# Patient Record
Sex: Female | Born: 1980 | Race: White | Hispanic: No | Marital: Married | Smoking: Never smoker
Health system: Southern US, Community
[De-identification: ages and names within clinical notes are randomized; demographics above are authoritative.]

## PROBLEM LIST (undated history)

## (undated) DIAGNOSIS — Z789 Other specified health status: Secondary | ICD-10-CM

## (undated) DIAGNOSIS — IMO0001 Reserved for inherently not codable concepts without codable children: Secondary | ICD-10-CM

## (undated) HISTORY — PX: EYE SURGERY: SHX253

---

## 2003-04-05 ENCOUNTER — Encounter: Payer: Self-pay | Admitting: Family Medicine

## 2003-04-05 ENCOUNTER — Encounter: Admission: RE | Admit: 2003-04-05 | Discharge: 2003-04-05 | Payer: Self-pay | Admitting: Family Medicine

## 2004-03-20 ENCOUNTER — Emergency Department (HOSPITAL_COMMUNITY): Admission: EM | Admit: 2004-03-20 | Discharge: 2004-03-20 | Payer: Self-pay | Admitting: Emergency Medicine
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
## 2007-02-15 ENCOUNTER — Other Ambulatory Visit: Admission: RE | Admit: 2007-02-15 | Discharge: 2007-02-15 | Payer: Self-pay | Admitting: Family Medicine

## 2008-03-12 ENCOUNTER — Other Ambulatory Visit: Admission: RE | Admit: 2008-03-12 | Discharge: 2008-03-12 | Payer: Self-pay | Admitting: Family Medicine

## 2009-03-18 ENCOUNTER — Other Ambulatory Visit: Admission: RE | Admit: 2009-03-18 | Discharge: 2009-03-18 | Payer: Self-pay | Admitting: Family Medicine

## 2010-10-28 ENCOUNTER — Inpatient Hospital Stay (HOSPITAL_COMMUNITY): Admission: AD | Admit: 2010-10-28 | Discharge: 2010-10-30 | Payer: Self-pay | Admitting: Obstetrics and Gynecology

## 2011-02-10 LAB — CBC
HCT: 38.4 % (ref 36.0–46.0)
Hemoglobin: 10.1 g/dL — ABNORMAL LOW (ref 12.0–15.0)
MCH: 32.7 pg (ref 26.0–34.0)
MCH: 33.4 pg (ref 26.0–34.0)
MCHC: 34.5 g/dL (ref 30.0–36.0)
MCV: 95.3 fL (ref 78.0–100.0)
MCV: 96.9 fL (ref 78.0–100.0)
Platelets: 277 10*3/uL (ref 150–400)
RBC: 3.02 MIL/uL — ABNORMAL LOW (ref 3.87–5.11)
RBC: 4.03 MIL/uL (ref 3.87–5.11)

## 2011-12-01 NOTE — L&D Delivery Note (Signed)
Delivery Note  Labored in bath tub mostly. OO tub to bed at 0745, + urge to bear down  AROM at 0756, light mec fluid C/C/+2 0758 FHR category 1 in 2nd stage  At 8:11 AM a viable female was delivered via Vaginal, Spontaneous Delivery (Presentation: Right Occiput Anterior).  APGAR: 8, 9; weight pending.   Placenta status: Intact, Spontaneous.  Cord: 3 vessels with the following complications: loose CAN x1, reduced.  Cord pH: not done. Cord clamped after pulsation stopped, cut by FOB.   Anesthesia: None  Episiotomy: None Lacerations: 2nd degree Suture Repair: 3.0 vicryl rapide Est. Blood Loss (mL): 350  Mom to postpartum.  Baby to mother for bonding.Marland Kitchen  Sheleen Conchas 08/06/2012, 8:44 AM

## 2011-12-17 LAB — OB RESULTS CONSOLE HIV ANTIBODY (ROUTINE TESTING): HIV: NONREACTIVE

## 2011-12-17 LAB — OB RESULTS CONSOLE HEPATITIS B SURFACE ANTIGEN: Hepatitis B Surface Ag: NEGATIVE

## 2011-12-17 LAB — OB RESULTS CONSOLE RPR: RPR: NONREACTIVE

## 2011-12-17 LAB — OB RESULTS CONSOLE ABO/RH: RH Type: POSITIVE

## 2012-08-06 ENCOUNTER — Inpatient Hospital Stay (HOSPITAL_COMMUNITY)
Admission: AD | Admit: 2012-08-06 | Discharge: 2012-08-08 | DRG: 775 | Disposition: A | Discharge status: Home / Self Care | Payer: 59 | Source: Ambulatory Visit | Attending: Obstetrics & Gynecology | Admitting: Obstetrics & Gynecology

## 2012-08-06 ENCOUNTER — Encounter (HOSPITAL_COMMUNITY): Payer: Self-pay | Admitting: *Deleted

## 2012-08-06 DIAGNOSIS — IMO0001 Reserved for inherently not codable concepts without codable children: Secondary | ICD-10-CM

## 2012-08-06 HISTORY — DX: Reserved for inherently not codable concepts without codable children: IMO0001

## 2012-08-06 HISTORY — DX: Other specified health status: Z78.9

## 2012-08-06 LAB — CBC
HCT: 36.8 % (ref 36.0–46.0)
MCHC: 33.7 g/dL (ref 30.0–36.0)
Platelets: 223 10*3/uL (ref 150–400)
RDW: 13.5 % (ref 11.5–15.5)
WBC: 15.7 10*3/uL — ABNORMAL HIGH (ref 4.0–10.5)

## 2012-08-06 MED ORDER — IBUPROFEN 600 MG PO TABS
600.0000 mg | ORAL_TABLET | Freq: Four times a day (QID) | ORAL | Status: DC | PRN
  Administered 2012-08-06: 600 mg via ORAL
  Filled 2012-08-06: qty 1

## 2012-08-06 MED ORDER — ONDANSETRON HCL 4 MG/2ML IJ SOLN: 4.0000 mg | Freq: Four times a day (QID) | INTRAMUSCULAR | Status: DC | PRN

## 2012-08-06 MED ORDER — FLEET ENEMA 7-19 GM/118ML RE ENEM: 1.0000 | ENEMA | Freq: Every day | RECTAL | Status: DC | PRN

## 2012-08-06 MED ORDER — ONDANSETRON HCL 4 MG/2ML IJ SOLN: 4.0000 mg | INTRAMUSCULAR | Status: DC | PRN

## 2012-08-06 MED ORDER — IBUPROFEN 600 MG PO TABS
600.0000 mg | ORAL_TABLET | Freq: Four times a day (QID) | ORAL | Status: DC
  Administered 2012-08-06 – 2012-08-08 (×8): 600 mg via ORAL
  Filled 2012-08-06 (×8): qty 1

## 2012-08-06 MED ORDER — BISACODYL 10 MG RE SUPP: 10.0000 mg | Freq: Every day | RECTAL | Status: DC | PRN

## 2012-08-06 MED ORDER — DIBUCAINE 1 % RE OINT: 1.0000 "application " | TOPICAL_OINTMENT | RECTAL | Status: DC | PRN

## 2012-08-06 MED ORDER — TETANUS-DIPHTH-ACELL PERTUSSIS 5-2.5-18.5 LF-MCG/0.5 IM SUSP
0.5000 mL | Freq: Once | INTRAMUSCULAR | Status: AC
  Administered 2012-08-07: 0.5 mL via INTRAMUSCULAR
  Filled 2012-08-06: qty 0.5

## 2012-08-06 MED ORDER — PRENATAL MULTIVITAMIN CH
1.0000 | ORAL_TABLET | Freq: Every day | ORAL | Status: DC
  Administered 2012-08-06 – 2012-08-08 (×3): 1 via ORAL
  Filled 2012-08-06 (×3): qty 1

## 2012-08-06 MED ORDER — OXYTOCIN 40 UNITS IN LACTATED RINGERS INFUSION - SIMPLE MED: 62.5000 mL/h | Freq: Once | INTRAVENOUS | Status: DC

## 2012-08-06 MED ORDER — WITCH HAZEL-GLYCERIN EX PADS: 1.0000 "application " | MEDICATED_PAD | CUTANEOUS | Status: DC | PRN

## 2012-08-06 MED ORDER — DIPHENHYDRAMINE HCL 25 MG PO CAPS: 25.0000 mg | ORAL_CAPSULE | Freq: Four times a day (QID) | ORAL | Status: DC | PRN

## 2012-08-06 MED ORDER — LANOLIN HYDROUS EX OINT: TOPICAL_OINTMENT | CUTANEOUS | Status: DC | PRN

## 2012-08-06 MED ORDER — ZOLPIDEM TARTRATE 5 MG PO TABS: 5.0000 mg | ORAL_TABLET | Freq: Every evening | ORAL | Status: DC | PRN

## 2012-08-06 MED ORDER — OXYCODONE-ACETAMINOPHEN 5-325 MG PO TABS
1.0000 | ORAL_TABLET | ORAL | Status: DC | PRN
  Administered 2012-08-06: 1 via ORAL
  Administered 2012-08-07: 2 via ORAL
  Filled 2012-08-06 (×2): qty 2

## 2012-08-06 MED ORDER — OXYTOCIN 10 UNIT/ML IJ SOLN
10.0000 [IU] | Freq: Once | INTRAMUSCULAR | Status: DC
  Administered 2012-08-06: 10 [IU] via INTRAMUSCULAR
  Filled 2012-08-06: qty 1

## 2012-08-06 MED ORDER — ACETAMINOPHEN 325 MG PO TABS: 650.0000 mg | ORAL_TABLET | ORAL | Status: DC | PRN

## 2012-08-06 MED ORDER — FLEET ENEMA 7-19 GM/118ML RE ENEM: 1.0000 | ENEMA | RECTAL | Status: DC | PRN

## 2012-08-06 MED ORDER — BENZOCAINE-MENTHOL 20-0.5 % EX AERO
1.0000 "application " | INHALATION_SPRAY | CUTANEOUS | Status: DC | PRN
  Administered 2012-08-06: 1 via TOPICAL
  Filled 2012-08-06: qty 56

## 2012-08-06 MED ORDER — ONDANSETRON HCL 4 MG PO TABS: 4.0000 mg | ORAL_TABLET | ORAL | Status: DC | PRN

## 2012-08-06 MED ORDER — OXYCODONE-ACETAMINOPHEN 5-325 MG PO TABS
1.0000 | ORAL_TABLET | ORAL | Status: DC | PRN
  Administered 2012-08-06: 2 via ORAL
  Filled 2012-08-06: qty 2

## 2012-08-06 MED ORDER — LIDOCAINE HCL (PF) 1 % IJ SOLN
30.0000 mL | INTRAMUSCULAR | Status: DC | PRN
  Administered 2012-08-06: 30 mL via SUBCUTANEOUS
  Filled 2012-08-06: qty 30

## 2012-08-06 MED ORDER — SIMETHICONE 80 MG PO CHEW: 80.0000 mg | CHEWABLE_TABLET | ORAL | Status: DC | PRN

## 2012-08-06 MED ORDER — CITRIC ACID-SODIUM CITRATE 334-500 MG/5ML PO SOLN: 30.0000 mL | ORAL | Status: DC | PRN

## 2012-08-06 MED ORDER — LACTATED RINGERS IV SOLN: 500.0000 mL | INTRAVENOUS | Status: DC | PRN

## 2012-08-06 MED ORDER — OXYTOCIN BOLUS FROM INFUSION
500.0000 mL | Freq: Once | INTRAVENOUS | Status: DC
  Filled 2012-08-06: qty 500

## 2012-08-06 MED ORDER — SENNOSIDES-DOCUSATE SODIUM 8.6-50 MG PO TABS
2.0000 | ORAL_TABLET | Freq: Every day | ORAL | Status: DC
  Administered 2012-08-06 – 2012-08-07 (×2): 2 via ORAL

## 2012-08-06 NOTE — H&P (Signed)
Sherri Hodge is a 31 y.o. female presenting for labor. Desires unmedicated labor.  Maternal Medical History:  Reason for admission: Reason for admission: contractions and nausea (mild).  Started last night, more intense for past 3 hours.  Fetal activity: Perceived fetal activity is normal.   Last perceived fetal movement was within the past hour.    Prenatal complications: no prenatal complications   OB History    Grav Para Term Preterm Abortions TAB SAB Ect Mult Living   2 1 1       1      Past Medical History  Diagnosis Date  . Active labor 08/06/2012   Past Surgical History  Procedure Date  . Eye surgery     cyst removed as a child   Meds: PNV, blue cohosh, evening primrose oil  Family History: family history is not on file. Social History:  reports that she has never smoked. She does not have any smokeless tobacco history on file. She reports that she does not drink alcohol or use illicit drugs.   Prenatal Transfer Tool  Maternal Diabetes: No Genetic Screening: Declined Maternal Ultrasounds/Referrals: Normal Fetal Ultrasounds or other Referrals:  None Maternal Substance Abuse:  No Significant Maternal Medications:  None Significant Maternal Lab Results:  Lab values include: Group B Strep negative Other Comments:  None  Review of Systems  Eyes: Negative for blurred vision.  Gastrointestinal: Positive for nausea (mild).  Neurological: Negative for headaches.  All other systems reviewed and are negative.    Dilation: 6 Effacement (%): 100 Station: 0 Exam by:: Colon Flattery, CNM Last menstrual period 10/25/2011. Maternal Exam:  Uterine Assessment: Contraction strength is moderate.  Contraction duration is 60 seconds. Contraction frequency is regular.  Q 2-3 min   Abdomen: Patient reports no abdominal tenderness. Fundal height is S=D.   Estimated fetal weight is 7.5-8.   Fetal presentation: vertex  Introitus: Normal vulva. Normal vagina.  Pelvis: adequate for  delivery.   Proven to 7'13 Cervix: 6/100/0 BBOW    Fetal Exam Fetal Monitor Review: Mode: ultrasound.   Baseline rate: 150.  Variability: minimal (<5 bpm).   Pattern: no accelerations and no decelerations.    Fetal State Assessment: Category I - tracings are normal.     Filed Vitals:   08/06/12 0527  BP: 107/59  Pulse: 103  Temp: 98.4 F (36.9 C)  Resp: 18    Physical Exam  Constitutional: She is oriented to person, place, and time. She appears well-developed and well-nourished.  HENT:  Head: Normocephalic.  Eyes: Pupils are equal, round, and reactive to light.  Neck: Normal range of motion.  Cardiovascular: Normal rate.   Respiratory: Effort normal and breath sounds normal.  GI: Soft. She exhibits no distension.  Genitourinary: Vagina normal.  Musculoskeletal: Normal range of motion. She exhibits no edema.  Neurological: She is alert and oriented to person, place, and time.  Skin: Skin is dry.  Psychiatric: She has a normal mood and affect.    Prenatal labs: ABO, Rh: A/Positive/-- (01/17 0000) Antibody: Negative (01/17 0000) Rubella: Immune (01/17 0000) RPR: Nonreactive (01/17 0000)  HBsAg: Negative (01/17 0000)  HIV: Non-reactive (01/17 0000)  GBS: Negative (07/29 0000)  [place]: nl female anatomy Genetic screens declined 1GTT wnl  Assessment/Plan: IUP at term, spont labor Reassuring maternal / fetal status Desires natural labor GBS neg  Admit to BS Routine orders Anticipate NSVD   Consult Dr. Newman Pies 08/06/2012, 5:32 AM

## 2012-08-06 NOTE — Progress Notes (Signed)
Sherri Hodge is a 31 y.o. G2P1001 at [redacted]w[redacted]d by LMP admitted for active labor  Subjective: Breathing w/ ctx, coping well. OOB to ambulate after initial EFM strip. Good FM.  Objective: Filed Vitals:   08/06/12 0527 08/06/12 0540  BP: 107/59   Pulse: 103   Temp: 98.4 F (36.9 C)   TempSrc: Oral   Resp: 18   Height:  5' (1.524 m)  Weight:  70.761 kg (156 lb)      FHT:  FHR: 150 bpm, variability: moderate,  accelerations:  Present,  decelerations:  Present occasional early decel's UC:   irregular, every 2-4 minutes SVE:   Dilation: 6 Effacement (%): 100 Station: 0 Exam by:: Sherri Hodge, CNM  Labs:  No results found for this basename: WBC:2,HGB:2,HCT:2,PLT:2 in the last 72 hours  Assessment / Plan: IUP at 40.6 wks, spont labor  Labor: Progressing normally Preeclampsia:  n/a Fetal Wellbeing:  Category I Pain Control:  Labor support without medications I/D:  GBS neg Anticipated MOD:  NSVD  Sherri Hodge,Sherri Hodge 08/06/2012, 5:53 AM

## 2012-08-06 NOTE — Plan of Care (Signed)
Problem: Consults Goal: Birthing Suites Patient Information Press F2 to bring up selections list Outcome: Completed/Met Date Met:  08/06/12  Pt > [redacted] weeks EGA  Comments:  40.6 wks

## 2012-08-07 LAB — CBC
HCT: 30 % — ABNORMAL LOW (ref 36.0–46.0)
MCH: 31.4 pg (ref 26.0–34.0)
MCHC: 33.7 g/dL (ref 30.0–36.0)
RDW: 13.6 % (ref 11.5–15.5)

## 2012-08-07 NOTE — Progress Notes (Signed)
PPD 1 SVD  S:  Reports feeling well.             Tolerating po/ No nausea or vomiting             Bleeding is moderate, small clot this am.             Pain controlled with Motrin             Up ad lib / ambulatory / voiding well   Newborn  Information for the patient's newborn:  [name], [name] [[id number]]  female  breast feeding     O:  A & O x 3 NAD             VS:  Filed Vitals:   08/06/12 1158 08/06/12 1558 08/06/12 2358 08/07/12 0553  BP: 116/72 108/68 100/67 91/60  Pulse: 86 86 86 60  Temp: 98.7 F (37.1 C) 98.1 F (36.7 C) 98.3 F (36.8 C) 98.1 F (36.7 C)  TempSrc: Oral Oral Oral Oral  Resp: 18 18 18 18   Height:      Weight:      SpO2: 99% 98% 93%     LABS:  Basename 08/07/12 0500 08/06/12 0631  WBC 15.4* 15.7*  HGB 10.1* 12.4  HCT 30.0* 36.8  PLT 203 223    Blood type: A/Positive/-- (01/17 0000)  Rubella: Immune (01/17 0000)   I&O: I/O last 3 completed shifts: In: -  Out: 350 [Blood:350]        Abdomen: soft, non-tender, non-distended              Fundus: firm, non-tender, U -2  Perineum: repair intact, no edema  Lochia: small  Extremities: no edema, no calf pain or tenderness, neg Homans    A/P: PPD # 1 31 y.o., [id number]    Active Problems:  NSVD (normal spontaneous vaginal delivery) (9/7)   Doing well - stable status  Routine post partum orders  Anticipate discharge home in AM.   [name], CNM 08/07/2012, 9:21 AM

## 2012-08-08 MED ORDER — IBUPROFEN 600 MG PO TABS: 600.0000 mg | ORAL_TABLET | Freq: Four times a day (QID) | ORAL | Status: AC

## 2012-08-08 NOTE — Discharge Summary (Signed)
Obstetric Discharge Summary Reason for Admission: onset of labor Prenatal Procedures: ultrasound Intrapartum Procedures: spontaneous vaginal delivery Postpartum Procedures: TDaP vaccine Complications-Operative and Postpartum: 2nd degree perineal laceration Hemoglobin  Date Value Range Status  08/07/2012 10.1* 12.0 - 15.0 g/dL Final     DELTA CHECK NOTED     REPEATED TO VERIFY     HCT  Date Value Range Status  08/07/2012 30.0* 36.0 - 46.0 % Final    Physical Exam:  General: alert, cooperative and no distress Lochia: appropriate Uterine Fundus: firm Incision: healing well DVT Evaluation: Negative Homan's sign. No significant calf/ankle edema.  Discharge Diagnoses: Term Pregnancy-delivered  Discharge Information: Date: 08/08/2012 Activity: pelvic rest Diet: routine Medications: PNV and Ibuprofen Condition: stable Instructions: refer to practice specific booklet Discharge to: home Follow-up Information    Follow up with PAUL,DANIELA, CNM in 6 weeks.   Contact information:   [address] [address] [phone]          Newborn Data: Live born female "Evangeline" Birth Weight: 8 lb 0.6 oz (3645 g) APGAR: 8, 9  Home with mother.  PAUL,DANIELA 08/08/2012, 8:40 AM

## 2012-08-08 NOTE — Progress Notes (Signed)
Post Partum Day #2            Information for the patient's newborn:  Particia, Strahm [[id number]]  female Feeding: breast  Subjective: No HA, SOB, CP, F/C, breast symptoms. Pain minimal. Normal vaginal bleeding, no clots.      Objective:  Temp:  [98 F (36.7 C)-98.3 F (36.8 C)] 98.3 F (36.8 C) (09/09 0526) Pulse Rate:  [89-96] 94  (09/09 0526) Resp:  [16-18] 16  (09/09 0526) BP: (102-109)/(65-69) 104/65 mmHg (09/09 0526) SpO2:  [98 %] 98 % (09/09 0526)  No intake or output data in the 24 hours ending 08/08/12 0830     Basename 08/07/12 0500 08/06/12 0631  WBC 15.4* 15.7*  HGB 10.1* 12.4  HCT 30.0* 36.8  PLT 203 223    Blood type: A/Positive/-- (01/17 0000) Rubella: Immune (01/17 0000)    Physical Exam:  General: alert, cooperative and no distress Uterine Fundus: firm Lochia: appropriate Perineum: repair intact, edema none DVT Evaluation: Negative Homan's sign. No significant calf/ankle edema.    Assessment/Plan: PPD # 2 / 31 y.o., [id number] S/P:spontaneous vaginal   Active Problems:  NSVD (normal spontaneous vaginal delivery) (9/7)    normal postpartum exam  Continue current postpartum care  D/C home   LOS: 2 days   Tylisha Danis, CNM, MSN 08/08/2012, 8:30 AM

## 2013-11-30 NOTE — L&D Delivery Note (Signed)
Delivery Note  First Stage: Labor onset: 0600 Augmentation: AROM, Pitocin Analgesia /Anesthesia intrapartum: none AROM at 1337  Second Stage: Complete dilation at 1928 Onset of pushing at 1928 FHR second stage 150, +accels, no decels  Delivery of a viable female at 211938 by CNM in OA position No nuchal cord Cord double clamped after cessation of pulsation, cut by FOB Cord blood sample collected   Collection of cord blood donation n/a Arterial cord blood sample n/a  Third Stage: Placenta delivered via Tomasa BlaseSchultz intact with 3 VC @ 1945 Placenta disposition: routine disposal Uterine tone firm / bleeding small  1st degree laceration identified  Anesthesia for repair: local Repaired with 3-0 Vicryl rapide Est. Blood Loss (mL): 200  Complications: none  Mom to postpartum.  Baby to Couplet care / Skin to Skin.  Newborn: Birth Weight: pending  Apgar Scores: 7/8 Feeding planned: breast  Donette LarryBHAMBRI, Finlay Godbee, N MSN, CNM 07/17/2014, 8:17 PM

## 2013-12-14 LAB — OB RESULTS CONSOLE HIV ANTIBODY (ROUTINE TESTING): HIV: NONREACTIVE

## 2013-12-14 LAB — OB RESULTS CONSOLE ANTIBODY SCREEN: ANTIBODY SCREEN: NEGATIVE

## 2013-12-14 LAB — OB RESULTS CONSOLE ABO/RH: RH Type: POSITIVE

## 2013-12-14 LAB — OB RESULTS CONSOLE HEPATITIS B SURFACE ANTIGEN: Hepatitis B Surface Ag: NEGATIVE

## 2013-12-14 LAB — OB RESULTS CONSOLE RPR: RPR: NONREACTIVE

## 2013-12-14 LAB — OB RESULTS CONSOLE RUBELLA ANTIBODY, IGM: RUBELLA: IMMUNE

## 2013-12-22 LAB — OB RESULTS CONSOLE GC/CHLAMYDIA
Chlamydia: NEGATIVE
GC PROBE AMP, GENITAL: NEGATIVE

## 2014-07-17 ENCOUNTER — Inpatient Hospital Stay (HOSPITAL_COMMUNITY)
Admission: AD | Admit: 2014-07-17 | Discharge: 2014-07-19 | DRG: 775 | Disposition: A | Discharge status: Home / Self Care | Payer: 59 | Source: Ambulatory Visit | Attending: Obstetrics | Admitting: Obstetrics

## 2014-07-17 ENCOUNTER — Encounter (HOSPITAL_COMMUNITY): Payer: Self-pay | Admitting: *Deleted

## 2014-07-17 DIAGNOSIS — O480 Post-term pregnancy: Secondary | ICD-10-CM | POA: Diagnosis present

## 2014-07-17 LAB — CBC
HCT: 37.8 % (ref 36.0–46.0)
HEMATOCRIT: 36.9 % (ref 36.0–46.0)
Hemoglobin: 12.9 g/dL (ref 12.0–15.0)
Hemoglobin: 12.6 g/dL (ref 12.0–15.0)
MCH: 31.5 pg (ref 26.0–34.0)
MCH: 31.2 pg (ref 26.0–34.0)
MCHC: 34.1 g/dL (ref 30.0–36.0)
MCHC: 34.1 g/dL (ref 30.0–36.0)
MCV: 92.4 fL (ref 78.0–100.0)
MCV: 91.3 fL (ref 78.0–100.0)
PLATELETS: 238 10*3/uL (ref 150–400)
Platelets: 253 10*3/uL (ref 150–400)
RBC: 4.09 MIL/uL (ref 3.87–5.11)
RBC: 4.04 MIL/uL (ref 3.87–5.11)
RDW: 13.3 % (ref 11.5–15.5)
RDW: 13.2 % (ref 11.5–15.5)
WBC: 12.9 10*3/uL — ABNORMAL HIGH (ref 4.0–10.5)
WBC: 20.5 10*3/uL — AB (ref 4.0–10.5)

## 2014-07-17 LAB — ABO/RH: ABO/RH(D): A POS

## 2014-07-17 LAB — RPR

## 2014-07-17 LAB — TYPE AND SCREEN
ABO/RH(D): A POS
Antibody Screen: NEGATIVE

## 2014-07-17 LAB — SAMPLE TO BLOOD BANK

## 2014-07-17 LAB — OB RESULTS CONSOLE GBS: GBS: NEGATIVE

## 2014-07-17 MED ORDER — TETANUS-DIPHTH-ACELL PERTUSSIS 5-2.5-18.5 LF-MCG/0.5 IM SUSP
0.5000 mL | Freq: Once | INTRAMUSCULAR | Status: DC
Start: 2014-07-18 — End: 2014-07-18

## 2014-07-17 MED ORDER — OXYTOCIN 40 UNITS IN LACTATED RINGERS INFUSION - SIMPLE MED
62.5000 mL/h | INTRAVENOUS | Status: DC
  Administered 2014-07-17: 62.5 mL/h via INTRAVENOUS

## 2014-07-17 MED ORDER — MISOPROSTOL 200 MCG PO TABS
ORAL_TABLET | ORAL | Status: AC
  Filled 2014-07-17: qty 4

## 2014-07-17 MED ORDER — ZOLPIDEM TARTRATE 5 MG PO TABS: 5.0000 mg | ORAL_TABLET | Freq: Every evening | ORAL | Status: DC | PRN

## 2014-07-17 MED ORDER — OXYCODONE-ACETAMINOPHEN 5-325 MG PO TABS: 1.0000 | ORAL_TABLET | ORAL | Status: DC | PRN

## 2014-07-17 MED ORDER — IBUPROFEN 600 MG PO TABS
600.0000 mg | ORAL_TABLET | Freq: Four times a day (QID) | ORAL | Status: DC | PRN
  Administered 2014-07-17: 600 mg via ORAL
  Filled 2014-07-17: qty 1

## 2014-07-17 MED ORDER — SENNOSIDES-DOCUSATE SODIUM 8.6-50 MG PO TABS
2.0000 | ORAL_TABLET | ORAL | Status: DC
  Administered 2014-07-17 – 2014-07-18 (×2): 2 via ORAL
  Filled 2014-07-17 (×2): qty 2

## 2014-07-17 MED ORDER — LIDOCAINE HCL (PF) 1 % IJ SOLN
30.0000 mL | INTRAMUSCULAR | Status: DC | PRN
  Administered 2014-07-17: 30 mL via SUBCUTANEOUS
  Filled 2014-07-17: qty 30

## 2014-07-17 MED ORDER — LACTATED RINGERS IV SOLN
INTRAVENOUS | Status: DC
  Administered 2014-07-17: 19:00:00 via INTRAVENOUS

## 2014-07-17 MED ORDER — ONDANSETRON HCL 4 MG/2ML IJ SOLN: 4.0000 mg | Freq: Four times a day (QID) | INTRAMUSCULAR | Status: DC | PRN

## 2014-07-17 MED ORDER — ONDANSETRON HCL 4 MG PO TABS: 4.0000 mg | ORAL_TABLET | ORAL | Status: DC | PRN

## 2014-07-17 MED ORDER — OXYTOCIN 40 UNITS IN LACTATED RINGERS INFUSION - SIMPLE MED
1.0000 m[IU]/min | INTRAVENOUS | Status: DC
  Administered 2014-07-17: 2 m[IU]/min via INTRAVENOUS
  Filled 2014-07-17: qty 1000

## 2014-07-17 MED ORDER — BENZOCAINE-MENTHOL 20-0.5 % EX AERO
1.0000 "application " | INHALATION_SPRAY | CUTANEOUS | Status: DC | PRN
  Administered 2014-07-17: 1 via TOPICAL
  Filled 2014-07-17: qty 56

## 2014-07-17 MED ORDER — LANOLIN HYDROUS EX OINT: TOPICAL_OINTMENT | CUTANEOUS | Status: DC | PRN

## 2014-07-17 MED ORDER — DIBUCAINE 1 % RE OINT: 1.0000 "application " | TOPICAL_OINTMENT | RECTAL | Status: DC | PRN

## 2014-07-17 MED ORDER — OXYTOCIN BOLUS FROM INFUSION: 500.0000 mL | INTRAVENOUS | Status: DC

## 2014-07-17 MED ORDER — MISOPROSTOL 200 MCG PO TABS
800.0000 ug | ORAL_TABLET | Freq: Once | ORAL | Status: AC
Start: 2014-07-17 — End: 2014-07-17
  Administered 2014-07-17: 800 ug via RECTAL

## 2014-07-17 MED ORDER — PRENATAL MULTIVITAMIN CH
1.0000 | ORAL_TABLET | Freq: Every day | ORAL | Status: DC
  Administered 2014-07-18 – 2014-07-19 (×2): 1 via ORAL
  Filled 2014-07-17 (×2): qty 1

## 2014-07-17 MED ORDER — OXYTOCIN 10 UNIT/ML IJ SOLN: 10.0000 [IU] | Freq: Once | INTRAMUSCULAR | Status: DC

## 2014-07-17 MED ORDER — IBUPROFEN 600 MG PO TABS
600.0000 mg | ORAL_TABLET | Freq: Four times a day (QID) | ORAL | Status: DC
  Administered 2014-07-18 – 2014-07-19 (×6): 600 mg via ORAL
  Filled 2014-07-17 (×6): qty 1

## 2014-07-17 MED ORDER — LACTATED RINGERS IV SOLN: 500.0000 mL | INTRAVENOUS | Status: DC | PRN

## 2014-07-17 MED ORDER — CITRIC ACID-SODIUM CITRATE 334-500 MG/5ML PO SOLN: 30.0000 mL | ORAL | Status: DC | PRN

## 2014-07-17 MED ORDER — WITCH HAZEL-GLYCERIN EX PADS: 1.0000 "application " | MEDICATED_PAD | CUTANEOUS | Status: DC | PRN

## 2014-07-17 MED ORDER — OXYCODONE-ACETAMINOPHEN 5-325 MG PO TABS
1.0000 | ORAL_TABLET | ORAL | Status: DC | PRN
  Administered 2014-07-17: 1 via ORAL
  Filled 2014-07-17: qty 1

## 2014-07-17 MED ORDER — SIMETHICONE 80 MG PO CHEW: 80.0000 mg | CHEWABLE_TABLET | ORAL | Status: DC | PRN

## 2014-07-17 MED ORDER — DIPHENHYDRAMINE HCL 25 MG PO CAPS: 25.0000 mg | ORAL_CAPSULE | Freq: Four times a day (QID) | ORAL | Status: DC | PRN

## 2014-07-17 MED ORDER — TERBUTALINE SULFATE 1 MG/ML IJ SOLN: 0.2500 mg | Freq: Once | INTRAMUSCULAR | Status: DC | PRN

## 2014-07-17 MED ORDER — ONDANSETRON HCL 4 MG/2ML IJ SOLN: 4.0000 mg | INTRAMUSCULAR | Status: DC | PRN

## 2014-07-17 NOTE — Progress Notes (Signed)
Pt called out saying the baby was coming. RN entered the room to pt involuntary pushing

## 2014-07-17 NOTE — H&P (Signed)
  OB ADMISSION/ HISTORY & PHYSICAL:  Admission Date: 07/17/2014  9:49 AM  Admit Diagnosis: 40.[redacted] weeks gestation, post-dates pregnancy  Sherri Hodge is a 33 y.o. female presenting for painful ctx, more consistent since early this am.  Seen in office this am for labor check and cervical progression noted.   Prenatal History: [id number]   EDC:07/11/2014, by LMP   [facility]  Primary Ob Provider: Donette LarryMelanie Labrittany Wechter, CNM Prenatal course uncomplicated. EFW 8-10 (77%) and borderline poly-AFI 21.4 yesterday.  Prenatal Labs: ABO, Rh: A (01/15 0000) POS Antibody: Negative (01/15 0000) Rubella: Immune (01/15 0000)  RPR: Nonreactive (01/15 0000)  HBsAg: Negative (01/15 0000)  HIV: Non-reactive (01/15 0000)  GBS: Negative (08/18 0000)  1 hr GTT : 107  Medical / Surgical History :  Past medical history:  Past Medical History  Diagnosis Date  . Active labor 08/06/2012  . No pertinent past medical history   . NSVD (normal spontaneous vaginal delivery) (9/6) 08/06/2012     Past surgical history:  Past Surgical History  Procedure Laterality Date  . Eye surgery      cyst removed as a child    Family History: No family history on file.   Social History:  reports that she has never smoked. She does not have any smokeless tobacco history on file. She reports that she does not drink alcohol or use illicit drugs.  Allergies: Review of patient's allergies indicates no known allergies.   Current Medications at time of admission:  Prior to Admission medications   Medication Sig Start Date End Date Taking? Authorizing Provider  EVENING PRIMROSE OIL PO Place 1 capsule vaginally daily. Pt will not be using after delivery   Yes Historical Provider, MD  loratadine (CLARITIN) 10 MG tablet Take 10 mg by mouth daily.   Yes Historical Provider, MD  nystatin-triamcinolone (MYCOLOG II) cream Apply 1 application topically daily as needed (itching).   Yes Historical Provider, MD   OVER THE COUNTER MEDICATION Take 1 tablet by mouth daily. Pt takes Abbott LaboratoriesBlue Cohosh daily. Pt will not be continuing after delivery.   Yes Historical Provider, MD  Prenatal Vit-Fe Fumarate-FA (PRENATAL MULTIVITAMIN) TABS Take 1 tablet by mouth daily.   Yes Historical Provider, MD     Review of Systems: +FM +ctx, q2-10 min No LOF No bloody show  Physical Exam:  VS: Blood pressure 113/68, pulse 91, height 5' (1.524 m), weight 72.576 kg (160 lb), unknown if currently breastfeeding.  General: alert and oriented, appears comfortable w/ctx. Heart: RRR Lungs: Clear lung fields Abdomen: Gravid, soft and non-tender, non-distended / uterus: gravid, non-tender Extremities: No edema  Genitalia / VE: 6-7/80/-2, BBOW   FHR: baseline rate 140 / variability mod / accelerations present / no decelerations TOCO: 7-10 min, moderate  Assessment: 40.[redacted] weeks gestation First stage of labor FHR category I GBS negative  Plan:  Admit, intermittent fetal monitoring, planning un-medicated labor and birth, expectant management, anticipate SVD. Dr. Ernestina PennaFogleman notified of admission / plan of care   Lawernce PittsBHAMBRI, Shulamit Donofrio, N MSN, CNM 07/17/2014, 11:07 AM

## 2014-07-17 NOTE — Progress Notes (Signed)
Subjective:   Ctx becoming more intense, breathing through some of them, frequency unchanged.  Objective:   VS: Blood pressure 113/68, pulse 91, height 5' (1.524 m), weight 72.576 kg (160 lb), unknown if currently breastfeeding. FHR : baseline 145 / variability mod / accelerations no / early decelerations Toco: contractions every 8-10 minutes Cervix : 7/80/-2 Membranes: AROM, clear, large  Assessment:  Labor: first stage protracted FHR category I  Plan:  Augmentation with AROM-fetal vtx more applied to cervix, ambulate, position changes ad lib, anticipate SVD.     Sherri Hodge, Sherri Hodge, N MSN, CNM 07/17/2014, 1:45 PM

## 2014-07-17 NOTE — Progress Notes (Signed)
Subjective:   Ctx stronger and more frequent.   Objective:   VS: Blood pressure 116/73, pulse 99, temperature 98.2 F (36.8 C), temperature source Oral, height 5' (1.524 m), weight 72.576 kg (160 lb), unknown if currently breastfeeding. FHR : baseline 140 / variability mod / accelerations no / early decelerations Toco: contractions every 2-7 minutes, moderate Cervix : 7-8/80/-2, LOT Membranes: clear  Assessment:  Labor: first stage, active, arrested FHR category I  Plan:  Recommend Pitocin augmentation, r/b discussed, pt consents. Continuous EFM with wireless. Continue ambulation and position changes. Dr. Billy Coastaavon updated with A/P.      Sherri Hodge, Sherri Hodge, N MSN, CNM 07/17/2014, 6:26 PM

## 2014-07-17 NOTE — Progress Notes (Addendum)
Subjective:   Feeling more pelvic pressure. Ctx more frequent.  Objective:   VS: Blood pressure 115/63, pulse 86, height 5' (1.524 m), weight 72.576 kg (160 lb), unknown if currently breastfeeding. FHR : intermittent check-baseline 140 /no accelerations / no decelerations Toco: contractions every 5-7 minutes Cervix : 7-8/80/-2; LOT Membranes: clear  Assessment:  Labor: First stage, protracted FHR category I  Plan:  Occiput transverse or inadequate contractility likely etiology of protracted labor. Position changes, keep pelvis open, upright positions, consider Pitocin augmentation if no change at next check. Dr. Billy Coastaavon updated with A/P.    Donette LarryBHAMBRI, Josanna Hefel, N MSN, CNM 07/17/2014, 4:10 PM

## 2014-07-18 LAB — CBC
HCT: 36.9 % (ref 36.0–46.0)
HEMOGLOBIN: 12.5 g/dL (ref 12.0–15.0)
MCH: 30.9 pg (ref 26.0–34.0)
MCHC: 33.9 g/dL (ref 30.0–36.0)
MCV: 91.1 fL (ref 78.0–100.0)
PLATELETS: 232 10*3/uL (ref 150–400)
RBC: 4.05 MIL/uL (ref 3.87–5.11)
RDW: 13.2 % (ref 11.5–15.5)
WBC: 16.1 10*3/uL — AB (ref 4.0–10.5)

## 2014-07-18 NOTE — Progress Notes (Signed)
2050-Call from RN for heavy bleeding while pt walking to BR and large amt noted in toilet. Waiting for pt to void. Pt feels fine, asymptomatic. Cytotec 800 mcg PR ordered and I/O cath if unable to void. 2104-RN update-pt able to void, Cytotec just placed, fundus firm with massage, bleeding still large, one large clot expressed. VSS, asymptomatic. EBL 200 ml. Bleeding small after fundal massage. 2125-RN update-fundus firm, bleeding small, trickle with fundal massage. VSS. Stat CBC ordered.  2201-RN update-fundus firm, bleeding small, CBC stable. Plan repeat CBC in am.

## 2014-07-18 NOTE — Progress Notes (Addendum)
Patient ID: Sherri Hodge, female   DOB: 01/05/1981, 33 y.o.   MRN: [id number] PPD # 1 SVD  S:  Reports feeling well             Tolerating po/ No nausea or vomiting             Bleeding is light             Pain controlled with ibuprofen (OTC)             Up ad lib / ambulatory / voiding without difficulties    Newborn  Information for the patient's newborn:  Cecille RubinFrye, Girl [name] [[id number]][[id number]]  female  breadt feeding very well   O:  A & O x 3, in no apparent distress              VS:  Filed Vitals:   07/17/14 2200 07/17/14 2300 07/18/14 0257 07/18/14 0619  BP: 115/68 124/64 102/51 103/62  Pulse: 94 102 87 76  Temp: 98.3 F (36.8 C) 98.1 F (36.7 C) 98.1 F (36.7 C) 97.9 F (36.6 C)  TempSrc: Oral Oral Oral Oral  Resp: 20 20 20 18   Height:      Weight:      SpO2:    99%    LABS:  Recent Labs  07/17/14 2133 07/18/14 0820  WBC 20.5* 16.1*  HGB 12.6 12.5  HCT 36.9 36.9  PLT 238 232    Blood type: --/--/A POS, A POS (08/18 1130)  Rubella: Immune (01/15 0000)   I&O: I/O last 3 completed shifts: In: -  Out: 450 [Blood:450]             Lungs: Clear and unlabored  Heart: regular rate and rhythm / no murmurs  Abdomen: soft, non-tender, non-distended              Fundus: firm, non-tender, U-1  Perineum: 1st degree repair healing well  Lochia: light  Extremities: no edema, no calf pain or tenderness, no Homans    A/P: PPD # 1  33 y.o., [id number]   Principal Problem:    Postpartum care following vaginal delivery (8/18)    Post-dates pregnancy - delivered  Increased bleeding immediately postpartum / treated with Cytotec 800 mcg - stable / asymptomatic and labs WNL   Doing well - stable status  Routine post partum orders  Anticipate discharge tomorrow    Raelyn MoraAWSON, Niveah Boerner, M, MSN, CNM 07/18/2014, 9:19 AM

## 2014-07-19 MED ORDER — IBUPROFEN 600 MG PO TABS: 600.0000 mg | ORAL_TABLET | Freq: Four times a day (QID) | ORAL | Status: DC

## 2014-07-19 NOTE — Lactation Note (Signed)
This note was copied from the chart of Sherri Wilfred Lacyatasha Ridley. Lactation Consultation Note  Patient Name: Sherri Hodge IONGE'XToday's Date: 07/19/2014 Reason for consult: Follow-up assessment Per mom breast feeding is going well, breast are fuller and feeling heavier , nipples tender. LC instructed mom on the use of comfort gels. Discussed prevention of sore nipples and engorgement. Mom aware to pump off if to full to start or if the baby only feeds on one breast. Mother informed of post-discharge support and given phone number to the lactation department, including services for phone  call assistance; out-patient appointments; and breastfeeding support group. List of other breastfeeding resources in the community  given in the handout. Encouraged mother to call for problems or concerns related to breastfeeding.    Maternal Data    Feeding    LATCH Score/Interventions                      Lactation Tools Discussed/Used Tools: Comfort gels (per mom breast are flillimg heavier, and nipples tender )   Consult Status Consult Status: Complete Date: 07/19/14    Kathrin Greathouseorio, Cristy Colmenares Ann 07/19/2014, 9:32 AM

## 2014-07-19 NOTE — Discharge Summary (Signed)
Obstetric Discharge Summary Reason for Admission: onset of labor Prenatal Course: uncomplicated Intrapartum Procedures: spontaneous vaginal delivery Postpartum Procedures: none Complications-Operative and Postpartum: 1st  degree perineal laceration Hemoglobin  Date Value Ref Range Status  07/18/2014 12.5  12.0 - 15.0 g/dL Final     HCT  Date Value Ref Range Status  07/18/2014 36.9  36.0 - 46.0 % Final    Physical Exam:  General: alert and cooperative Lochia: appropriate Uterine Fundus: firm Incision: healing well, no significant drainage, no dehiscence, no significant erythema DVT Evaluation: No evidence of DVT seen on physical exam. Negative Homan's sign. No cords or calf tenderness. No significant calf/ankle edema.  Discharge Diagnoses: Term Pregnancy-delivered  Discharge Information: Date: 07/19/2014 Activity: pelvic rest Diet: routine Medications: PNV and Ibuprofen Condition: stable Instructions: refer to practice specific booklet Discharge to: home Follow-up Information   Follow up with BHAMBRI, MELANIE, N, CNM. Schedule an appointment as soon as possible for a visit in 6 weeks.   Specialty:  Obstetrics and Gynecology   Contact information:   [address] [place] [id number] [phone]       Newborn Data: Live born female on 07/17/14 Birth Weight: 7 lb 7.6 oz (3391 g) APGAR: 7, 8  Home with mother.  BHAMBRI, MELANIE, N 07/19/2014, 1:12 PM

## 2014-10-01 ENCOUNTER — Encounter (HOSPITAL_COMMUNITY): Payer: Self-pay | Admitting: *Deleted

## 2016-11-30 NOTE — L&D Delivery Note (Signed)
Delivery Note  First Stage: Labor onset: 06/22/17 @ 0500am Augmentation : AROM, Pitocin  Analgesia Eliezer Lofts/Anesthesia intrapartum: Nitrous Oxide AROM at 1514pm - for clear fluid  Second Stage: Complete dilation at 1650pm Involuntary pushing at 1640pm FHR second stage Category 2  At 5:06 PM a viable female "Bishop DublinVera Leigh" was delivered via Vaginal, Spontaneous Delivery.  Presentation: vertex; Position: ROA  No nuchal cord. Terminal meconium noted.   Delivery of the head: 06/22/2017  5:05 PM - shoulder dystocia called First maneuver: 06/22/2017  5:05 PM, McRoberts Second maneuver: 06/22/2017  5:05 PM, Suprapubic Pressure Third maneuver: 06/22/2017  5:06 PM, Posterior arm delivered   Cord pH: not sent due to APGAR score  Cord double clamped after cessation of pulsation, cut by CNM and baby brought to warmer for NICU to assess  Cord blood sample collected   Third Stage: Placenta delivered via Tomasa BlaseSchultz intact with trailing membranes with 3 VC @ 1709 Placenta disposition: hospital disposal Uterine tone firm / bleeding minimal with massage and IV Pitocin 500mL bolus   1st degree laceration identified  Anesthesia for repair: 1% Lidocaine 30mL Repair: 3.0 Vicryl Est. Blood Loss (mL): 200mL  Mom to postpartum.  Baby to Couplet care / Skin to Skin.  Newborn: Birth Weight: 8 lb 12.7 oz (3989 g).  Apgar Scores: 8, 9 Feeding planned: Breast  Dr. Amado NashAlmquist notified   Carlean JewsMeredith Sigmon, MSN, CNM [facility]

## 2016-12-08 LAB — OB RESULTS CONSOLE RPR: RPR: NONREACTIVE

## 2016-12-08 LAB — OB RESULTS CONSOLE HIV ANTIBODY (ROUTINE TESTING): HIV: NONREACTIVE

## 2016-12-08 LAB — OB RESULTS CONSOLE RUBELLA ANTIBODY, IGM: RUBELLA: IMMUNE

## 2016-12-08 LAB — OB RESULTS CONSOLE HEPATITIS B SURFACE ANTIGEN: HEP B S AG: NEGATIVE

## 2017-04-28 LAB — OB RESULTS CONSOLE GC/CHLAMYDIA
Chlamydia: NEGATIVE
Gonorrhea: NEGATIVE

## 2017-05-24 LAB — OB RESULTS CONSOLE GBS: GBS: NEGATIVE

## 2017-06-22 ENCOUNTER — Inpatient Hospital Stay (HOSPITAL_COMMUNITY)
Admission: AD | Admit: 2017-06-22 | Discharge: 2017-06-24 | DRG: 775 | Disposition: A | Discharge status: Home / Self Care | Payer: Managed Care, Other (non HMO) | Source: Ambulatory Visit | Attending: Obstetrics and Gynecology | Admitting: Obstetrics and Gynecology

## 2017-06-22 ENCOUNTER — Encounter (HOSPITAL_COMMUNITY): Payer: Self-pay | Admitting: *Deleted

## 2017-06-22 DIAGNOSIS — Z3493 Encounter for supervision of normal pregnancy, unspecified, third trimester: Secondary | ICD-10-CM | POA: Diagnosis present

## 2017-06-22 DIAGNOSIS — Z3A40 40 weeks gestation of pregnancy: Secondary | ICD-10-CM

## 2017-06-22 LAB — CBC
HEMATOCRIT: 36.4 % (ref 36.0–46.0)
HEMOGLOBIN: 12.2 g/dL (ref 12.0–15.0)
MCH: 30.3 pg (ref 26.0–34.0)
MCHC: 33.5 g/dL (ref 30.0–36.0)
MCV: 90.5 fL (ref 78.0–100.0)
Platelets: 282 10*3/uL (ref 150–400)
RBC: 4.02 MIL/uL (ref 3.87–5.11)
RDW: 13.9 % (ref 11.5–15.5)
WBC: 13.2 10*3/uL — ABNORMAL HIGH (ref 4.0–10.5)

## 2017-06-22 LAB — TYPE AND SCREEN
ABO/RH(D): A POS
ANTIBODY SCREEN: NEGATIVE

## 2017-06-22 MED ORDER — OXYTOCIN BOLUS FROM INFUSION
500.0000 mL | Freq: Once | INTRAVENOUS | Status: AC
  Administered 2017-06-22: 500 mL via INTRAVENOUS

## 2017-06-22 MED ORDER — OXYTOCIN 40 UNITS IN LACTATED RINGERS INFUSION - SIMPLE MED: 2.5000 [IU]/h | INTRAVENOUS | Status: DC

## 2017-06-22 MED ORDER — IBUPROFEN 800 MG PO TABS
800.0000 mg | ORAL_TABLET | Freq: Once | ORAL | Status: AC
  Administered 2017-06-22: 800 mg via ORAL
  Filled 2017-06-22: qty 1

## 2017-06-22 MED ORDER — WITCH HAZEL-GLYCERIN EX PADS: 1.0000 "application " | MEDICATED_PAD | CUTANEOUS | Status: DC | PRN

## 2017-06-22 MED ORDER — DIBUCAINE 1 % RE OINT: 1.0000 "application " | TOPICAL_OINTMENT | RECTAL | Status: DC | PRN

## 2017-06-22 MED ORDER — TERBUTALINE SULFATE 1 MG/ML IJ SOLN
0.2500 mg | Freq: Once | INTRAMUSCULAR | Status: DC | PRN
  Filled 2017-06-22: qty 1

## 2017-06-22 MED ORDER — ONDANSETRON HCL 4 MG PO TABS: 4.0000 mg | ORAL_TABLET | ORAL | Status: DC | PRN

## 2017-06-22 MED ORDER — ACETAMINOPHEN 325 MG PO TABS: 650.0000 mg | ORAL_TABLET | ORAL | Status: DC | PRN

## 2017-06-22 MED ORDER — LACTATED RINGERS IV SOLN: 500.0000 mL | INTRAVENOUS | Status: DC | PRN

## 2017-06-22 MED ORDER — COCONUT OIL OIL: 1.0000 "application " | TOPICAL_OIL | Status: DC | PRN

## 2017-06-22 MED ORDER — SIMETHICONE 80 MG PO CHEW: 80.0000 mg | CHEWABLE_TABLET | ORAL | Status: DC | PRN

## 2017-06-22 MED ORDER — DIPHENHYDRAMINE HCL 25 MG PO CAPS: 25.0000 mg | ORAL_CAPSULE | Freq: Four times a day (QID) | ORAL | Status: DC | PRN

## 2017-06-22 MED ORDER — OXYTOCIN 40 UNITS IN LACTATED RINGERS INFUSION - SIMPLE MED
1.0000 m[IU]/min | INTRAVENOUS | Status: DC
  Administered 2017-06-22: 2 m[IU]/min via INTRAVENOUS
  Filled 2017-06-22: qty 1000

## 2017-06-22 MED ORDER — OXYCODONE-ACETAMINOPHEN 5-325 MG PO TABS
1.0000 | ORAL_TABLET | ORAL | Status: DC | PRN
  Administered 2017-06-22 – 2017-06-23 (×2): 1 via ORAL
  Filled 2017-06-22 (×2): qty 1

## 2017-06-22 MED ORDER — ONDANSETRON HCL 4 MG/2ML IJ SOLN: 4.0000 mg | INTRAMUSCULAR | Status: DC | PRN

## 2017-06-22 MED ORDER — ONDANSETRON HCL 4 MG/2ML IJ SOLN: 4.0000 mg | Freq: Four times a day (QID) | INTRAMUSCULAR | Status: DC | PRN

## 2017-06-22 MED ORDER — ZOLPIDEM TARTRATE 5 MG PO TABS: 5.0000 mg | ORAL_TABLET | Freq: Every evening | ORAL | Status: DC | PRN

## 2017-06-22 MED ORDER — SOD CITRATE-CITRIC ACID 500-334 MG/5ML PO SOLN: 30.0000 mL | ORAL | Status: DC | PRN

## 2017-06-22 MED ORDER — PRENATAL MULTIVITAMIN CH
1.0000 | ORAL_TABLET | Freq: Every day | ORAL | Status: DC
  Administered 2017-06-23: 1 via ORAL
  Filled 2017-06-22: qty 1

## 2017-06-22 MED ORDER — LIDOCAINE HCL (PF) 1 % IJ SOLN
30.0000 mL | INTRAMUSCULAR | Status: DC | PRN
  Administered 2017-06-22: 30 mL via SUBCUTANEOUS
  Filled 2017-06-22: qty 30

## 2017-06-22 MED ORDER — LACTATED RINGERS IV SOLN
INTRAVENOUS | Status: DC
  Administered 2017-06-22: 13:00:00 via INTRAVENOUS

## 2017-06-22 MED ORDER — SENNOSIDES-DOCUSATE SODIUM 8.6-50 MG PO TABS
2.0000 | ORAL_TABLET | ORAL | Status: DC
  Administered 2017-06-22 – 2017-06-24 (×2): 2 via ORAL
  Filled 2017-06-22 (×2): qty 2

## 2017-06-22 MED ORDER — IBUPROFEN 600 MG PO TABS
600.0000 mg | ORAL_TABLET | Freq: Four times a day (QID) | ORAL | Status: DC
  Administered 2017-06-23 – 2017-06-24 (×5): 600 mg via ORAL
  Filled 2017-06-22 (×5): qty 1

## 2017-06-22 MED ORDER — MISOPROSTOL 200 MCG PO TABS
ORAL_TABLET | ORAL | Status: AC
  Filled 2017-06-22: qty 4

## 2017-06-22 MED ORDER — BENZOCAINE-MENTHOL 20-0.5 % EX AERO: 1.0000 "application " | INHALATION_SPRAY | CUTANEOUS | Status: DC | PRN

## 2017-06-22 NOTE — Progress Notes (Signed)
S:  Pt. Requests to be checked and AROM if possible     Currently on Pitocin 4 milliunits     States she is tired from laboring over night      Breathing and frequent position changes during labor   O:  VS: Blood pressure 119/68, pulse 97, temperature 98.1 F (36.7 C), temperature source Oral, resp. rate 20, height 5' (1.524 m), weight 75.8 kg (167 lb), unknown if currently breastfeeding.        FHR : baseline 150 bpm / variability moderate / accelerations + / no decelerations        Toco: contractions every 2-4 minutes / moderate-strong         Cervix : Dilation: 7 Effacement (%): 90 Station: -1 Presentation: Vertex Exam by:: Evans Levee, cnm        Membranes: AROM - clear fluid  A: Active labor     FHR category 1     GBS Negative  P: Continue expectant management      Continuous labor support by husband      Anticipate NSVD  Updated Dr. Amado NashAlmquist with plan/progress   Carlean JewsMeredith Lorece Keach, MSN, CNM [facility]

## 2017-06-22 NOTE — Anesthesia Pain Management Evaluation Note (Signed)
  CRNA Pain Management Visit Note  Patient: Sherri Hodge, 36 y.o., female  "Hello I am a member of the anesthesia team at [facility]. We have an anesthesia team available at all times to provide care throughout the hospital, including epidural management and anesthesia for C-section. I don't know your plan for the delivery whether it a natural birth, water birth, IV sedation, nitrous supplementation, doula or epidural, but we want to meet your pain goals."   1.Was your pain managed to your expectations on prior hospitalizations?   Yes   2.What is your expectation for pain management during this hospitalization?     Epidural  3.How can we help you reach that goal? epidural  Record the patient's initial score and the patient's pain goal.   Pain: 7  Pain Goal: 10 [facility] wants you to be able to say your pain was always managed very well.  Cowan Pilar 06/22/2017

## 2017-06-22 NOTE — H&P (Signed)
OB ADMISSION/ HISTORY & PHYSICAL:  Admission Date: 06/22/2017 11:26 AM  Admit Diagnosis: Active labor at term  Sherri Hodge is a 36 y.o. female presenting for direct admission from the office today for active labor at term.  She was seen in the office yesterday and had her membranes swept at 4cm.  She reports contractions all night with bloody show, and this morning, she was 6-6.5cm.    Prenatal History: [id number]   EDC : 06/21/17 [facility] management  Primary Ob Provider: Arlan Organaniela Paul Prenatal course complicated by  - AMA  Prenatal Labs: ABO, Rh:  A Pos Antibody:  Negative Rubella:   Immune RPR:   NR HBsAg:   Negative HIV:   Negative GTT: 93 GBS:   Negative AFP/Quad Negative   Medical / Surgical History :  Past medical history:  Past Medical History:  Diagnosis Date  . Active labor 08/06/2012  . No pertinent past medical history   . NSVD (normal spontaneous vaginal delivery) (9/6) 08/06/2012     Past surgical history:  Past Surgical History:  Procedure Laterality Date  . EYE SURGERY     cyst removed as a child    Family History: No family history on file.   Social History:  reports that she has never smoked. She does not have any smokeless tobacco history on file. She reports that she does not drink alcohol or use drugs.   Allergies: Patient has no known allergies.    Current Medications at time of admission:  Prior to Admission medications   Medication Sig Start Date End Date Taking? Authorizing Provider  ibuprofen (ADVIL,MOTRIN) 600 MG tablet Take 1 tablet (600 mg total) by mouth every 6 (six) hours. 07/19/14   Donette LarryBhambri, Melanie, CNM  Prenatal Vit-Fe Fumarate-FA (PRENATAL MULTIVITAMIN) TABS Take 1 tablet by mouth daily.    [provider]     Review of Systems: Active FM onset of ctx @ last night currently every 5-6 minutes No LOF  / SROM bloody show present    Physical Exam:  VS: Blood pressure 116/69, pulse 97,  temperature 98.1 F (36.7 C), temperature source Oral, resp. rate 18, height 5' (1.524 m), weight 75.8 kg (167 lb), unknown if currently breastfeeding.  General: alert and oriented, appears calm Heart: RRR Lungs: Clear lung fields Abdomen: Gravid, soft and non-tender, non-distended / uterus: gravid, non-tender Extremities: no edema  Genitalia / VE: Dilation: 6.5 Station: -2 Exam by:: M. Sigmon, CNM  FHR: baseline rate 145 bpm / variability moderate / accelerations + / no decelerations TOCO: every 5-6  Assessment: 40+[redacted] weeks gestation Active stage of labor FHR category 1 GBS Negative    Plan:  [facility]. [facility]   - Routine labor and delivery orders   - Pain management: none, considering nitrous oxide    - Hx. Of long, prodromal labors, open to AROM and/or Pitocin for augmentation if needed    - Desires to walk for the first few hours prior to intervention 2. GBS Negative     - No prophylaxis indicated 3. Postpartum:   - Breast feeding   - Contraception: none 4. Anticipate MOD: NSVD   - Proven pelvis: 8 pounds    Dr. Thurmon FairFogleman/Almquist notified of admission / plan of care  Carlean JewsMeredith Sigmon, MSN, [facility]

## 2017-06-22 NOTE — Progress Notes (Signed)
S:  Pt. States her ctxs are still feeling strong, but states she knows they are not adequate to progress to delivery.  She has been trying to ambulate.  We discussed options, and she would prefer AROM first, then Pitocin if necessary.  Discussed risks/benefits of AROM vs. Pitocin and will attempt AROM if head it well applied to cervix.   O:  VS: Blood pressure 116/67, pulse 93, temperature 98.1 F (36.7 C), temperature source Oral, resp. rate 18, height 5' (1.524 m), weight 75.8 kg (167 lb), unknown if currently breastfeeding.        FHR : baseline 145 bpm / variability moderate / accelerations + / no decelerations        Toco: contractions every 4-6 minutes / moderate         Cervix : Dilation: 6.5 Station: -2 Presentation: Vertex Exam by:: M. Delsin Copen, CNM        Membranes: Intact, BBOW  A: Active labor     FHR category 1  P: Discussed that the fetal head is not well applied to the cervix for AROM.  She agrees to begin Pitocin.      Begin Pitocin at 2 milliunits and increase by 2 milliunits     Planning natural labor, may try nitrous if needed      Reassess in 1-2 hours    Sherri JewsMeredith Rafeef Lau, MSN, CNM [facility]

## 2017-06-22 NOTE — Progress Notes (Signed)
S:  Pt. Feeling urge to push and would like to try the nitrous oxide   O:  VS: Blood pressure 118/62, pulse 86, temperature 98.2 F (36.8 C), temperature source Oral, resp. rate 18, height 5' (1.524 m), weight 75.8 kg (167 lb), SpO2 100 %, unknown if currently breastfeeding.        FHR : baseline 150 bpm / variability moderate / accelerations + / occasional variable decelerations        Toco: contractions every 2-3.5 minutes / strong        Cervix : 8cm/100/0        Membranes: AROM - clear fluid  A: Active labor     FHR category 2  P: Encouraged to stand up and use the bed as support to rock and allow for descent   Continuous labor support from husband and CNM    Anticipate NSVD soon     Carlean JewsMeredith Sigmon, MSN, CNM [facility]

## 2017-06-23 LAB — CBC
HEMATOCRIT: 32.7 % — AB (ref 36.0–46.0)
HEMOGLOBIN: 11.2 g/dL — AB (ref 12.0–15.0)
MCH: 30.9 pg (ref 26.0–34.0)
MCHC: 34.3 g/dL (ref 30.0–36.0)
MCV: 90.3 fL (ref 78.0–100.0)
PLATELETS: 257 10*3/uL (ref 150–400)
RBC: 3.62 MIL/uL — AB (ref 3.87–5.11)
RDW: 13.8 % (ref 11.5–15.5)
WBC: 17.4 10*3/uL — AB (ref 4.0–10.5)

## 2017-06-23 LAB — RPR: RPR: NONREACTIVE

## 2017-06-23 NOTE — Progress Notes (Signed)
PPD 1 SVD with 1st degree  S:  Reports feeling well             Tolerating po/ No nausea or vomiting             Bleeding is light             Pain controlled withmotrin             Up ad lib / ambulatory / voiding QS  Newborn breast feeding    O:    VS: BP 132/73 (BP Location: Left Arm)   Pulse 89   Temp 97.7 F (36.5 C) (Oral)   Resp 18   Ht 5' (1.524 m)   Wt 75.8 kg (167 lb)   SpO2 100%   Breastfeeding? Unknown   BMI 32.61 kg/m    LABS:              Recent Labs  06/22/17 1210 06/23/17 0521  WBC 13.2* 17.4*  HGB 12.2 11.2*  PLT 282 257               Blood type: --/--/A POS (07/24 1304)  Rubella: Immune (01/09 0000)                     I&O: Intake/Output      07/24 0701 - 07/25 0700 07/25 0701 - 07/26 0700   Blood 200    Total Output 200     Net -200                      Physical Exam:             Alert and oriented X3  Abdomen: soft, non-tender, non-distended              Fundus: firm, non-tender, U-1  Perineum: ice pack in place  Lochia: light  Extremities: trace pedal edema, no calf pain or tenderness    A: PPD # 1 with 1st degree repair   Doing well - stable status  P: Routine post partum orders  DC tomorrow  Marlinda MikeBAILEY, Juliany Daughety CNM, MSN, [facility] 06/23/2017, 8:58 AM

## 2017-06-23 NOTE — Lactation Note (Signed)
This note was copied from a baby's chart. Lactation Consultation Note  Patient Name: Sherri Hodge OZHYQ'MToday's Date: 06/23/2017 Reason for consult: Initial assessment   Initial consult with mom of 23 hour old infant. Mom Bf her 3 older children for 1 year each. Infant with 7 BF for 12-24 minutes, 1 attempt, 1 void and 2 stools since birth. LATCH scores 8-9.   Mom reports BF is going well. She reports infant is sleeping a lot. Enc her to undress and place her STS every couple of hours to offer her the breast. Mom asked about checking infant upper lip. Infant is noted to have a thick labial frenulum however there is very good stretchability to the upper lip. Infant with recessed chin and noted to have good tongue extension and elevation.   Enc mom to feed infant STS 8-12 x in 24 hours at first feeding cues for as long as infant desires, offering both breasts with each feeding. Enc mom to hand express prior to latch, mom reports she finds it hard to hand express and to pump.   BF Resources handout and LC Brochure given, mom informed of IP/OP Services, BF Support Groups and LC phone #. Mom has a Medela PIS at home. Mom is without questions/concerns at this time. Enc mom to call out for feeding assistance as needed.    Maternal Data Formula Feeding for Exclusion: No Has patient been taught Hand Expression?: Yes (does not feel it works for her) Does the patient have breastfeeding experience prior to this delivery?: Yes  Feeding Feeding Type: Breast Fed Length of feed: 12 min  LATCH Score/Interventions                      Lactation Tools Discussed/Used WIC Program: No   Consult Status Consult Status: Follow-up Date: 06/24/17 Follow-up type: In-patient    Silas FloodSharon S Peretz Thieme 06/23/2017, 4:11 PM

## 2017-06-24 MED ORDER — IBUPROFEN 600 MG PO TABS: 600.0000 mg | ORAL_TABLET | Freq: Four times a day (QID) | ORAL | 0 refills | Status: AC

## 2017-06-24 NOTE — Lactation Note (Signed)
This note was copied from a baby's chart. Lactation Consultation Note  Patient Name: Sherri Hodge OZHYQ'MToday's Date: 06/24/2017 Reason for consult: Follow-up assessment  Baby is 40 hours old,  LC reviewed and updated doc flow sheets  Per mom breast feeding is going well , the baby cluster fed all night and finally gave me a break After the 5:49 am feeding , next feeding was at 8:43 am.  Baby showing feeding cues while LC present. LC reminded mom to breast feed STS until the baby can  Stay awake for feeding. LC noted the baby have a recessed chin , high palate. Mom concerned about the  Baby's upper lip , and LC checked and the upper lip stretches well with exam and flipped to flange  Position when latched. Baby extended tongue over gumline short distance. And is able to elevate tongue.  LC assisted mom to obtain depth, multiple swallows noted, and LC showed dad how to ease chin and to make sure the upper lip is flanged. Baby fed 12 mins , and released, nipple well rounded, per mom comfortable.  Sore nipple and engorgement prevention and tx reviewed.  LC instructed mom on the use hand pump.  Per mom has a DEBP - Medela  Mother informed of post-discharge support and given phone number to the lactation department, including services for phone call assistance; out-patient appointments; and breastfeeding support group. List of other breastfeeding resources in the community given in the handout. Encouraged mother to call for problems or concerns related to breastfeeding.   Maternal Data    Feeding Feeding Type: Breast Fed Length of feed: 12 min  LATCH Score/Interventions Latch: Grasps breast easily, tongue down, lips flanged, rhythmical sucking. Intervention(s): Adjust position;Assist with latch;Breast massage;Breast compression  Audible Swallowing: Spontaneous and intermittent  Type of Nipple: Everted at rest and after stimulation  Comfort (Breast/Nipple): Soft / non-tender     Hold  (Positioning): Assistance needed to correctly position infant at breast and maintain latch. Intervention(s): Breastfeeding basics reviewed;Support Pillows;Position options;Skin to skin  LATCH Score: 9  Lactation Tools Discussed/Used Tools: Pump Breast pump type: Manual   Consult Status Consult Status: Follow-up Date: 06/24/17    Sherri Hodge 06/24/2017, 9:57 AM

## 2017-06-24 NOTE — Progress Notes (Signed)
PPD 2 SVD with 1st degree repair  S:  Reports feeling well             Tolerating po/ No nausea or vomiting             Bleeding is light             Pain controlled with motrin - some carpal tunnel still painful             Up ad lib / ambulatory / voiding QS  Newborn breast feeding  O:  VS: BP (!) 100/53   Pulse 93   Temp 98 F (36.7 C) (Oral)   Resp 16   Ht 5' (1.524 m)   Wt 75.8 kg (167 lb)   SpO2 99%   Breastfeeding? Unknown   BMI 32.61 kg/m    LABS:              Recent Labs  06/22/17 1210 06/23/17 0521  WBC 13.2* 17.4*  HGB 12.2 11.2*  PLT 282 257               Blood type: --/--/A POS (07/24 1304)  Rubella: Immune (01/09 0000)                                Physical Exam:             Alert and oriented X3  Abdomen: soft, non-tender, non-distended              Fundus: firm, non-tender, U-1  Perineum: no edema  Lochia: light  Extremities: trace dependent edema, no calf pain or tenderness  A: PPD # 2   Doing well - stable status  P: Routine post partum orders  DC home - WOB booklet - instructions reviewed  Marlinda MikeBAILEY, Shaquan Missey CNM, MSN, FACNM 06/24/2017, 9:05 AM

## 2017-07-05 NOTE — Discharge Summary (Signed)
OB Discharge Summary  Patient Name: Sherri Hodge DOB: June 01, 1981 MRN: [id number]  Date of admission: 06/22/2017  Admitting diagnosis: LABOR Intrauterine pregnancy: [redacted]w[redacted]d       Date of discharge: 07/05/2017    Discharge diagnosis: Term Pregnancy Delivered      Prenatal history: [id number]   EDC : 06/21/2017, by Patient Reported  [facility] at [facility]  Primary provider : Fredric Mare CNM Prenatal course uncomplicated  Prenatal Labs: ABO, Rh: --/--/A POS (07/24 1304)  Antibody: NEG (07/24 1304) Rubella: Immune (01/09 0000)   RPR: Non Reactive (07/24 1210)  HBsAg: Negative (01/09 0000)  HIV: Non-reactive (01/09 0000)  GBS: Negative (06/25 0000)                                    Hospital course:  Onset of Labor With Vaginal Delivery     36 y.o. yo [id number] at [redacted]w[redacted]d was admitted in Active Labor on 06/22/2017. Patient had an uncomplicated labor course as follows:  Membrane Rupture Time/Date: 3:14 PM ,06/22/2017   Intrapartum Procedures: Episiotomy: None [1]                                         Lacerations:  1st degree [2]  Patient had a delivery of a Viable infant. 06/22/2017  Information for the patient's newborn:  Siniyah, Evangelist [[id number]]  Delivery Method: Vag-Spont    Pateint had an uncomplicated postpartum course.  She is ambulating, tolerating a regular diet, passing flatus, and urinating well. Patient is discharged home in stable condition on 07/05/17. Delivering PROVIDER: SIGMON, MEREDITH C                                                            Complications: None  Newborn Data: Live born female  Birth Weight: 8 lb 12.7 oz (3989 g) APGAR: 8, 9  Baby Feeding: Breast Disposition:home with mother  Post partum procedures:none  Labs: Lab Results  Component Value Date   WBC 17.4 (H) 06/23/2017   HGB 11.2 (L) 06/23/2017   HCT 32.7 (L) 06/23/2017   MCV 90.3 06/23/2017   PLT 257 06/23/2017   No flowsheet data found.    Physical  Exam @ time of discharge:  Vitals:   06/23/17 0053 06/23/17 0439 06/23/17 1816 06/24/17 0506  BP: (!) 103/54 132/73 (!) 112/59 (!) 100/53  Pulse: 98 89 (!) 105 93  Resp: 18 18 19 16   Temp: 97.8 F (36.6 C) 97.7 F (36.5 C) 98.1 F (36.7 C) 98 F (36.7 C)  TempSrc: Oral Oral Oral Oral  SpO2: 100% 100% 97% 99%  Weight:      Height:        General: alert, cooperative and no distress Lochia: appropriate Uterine Fundus: firm Perineum: 2nd degree repair Incision: Healing well with no significant drainage Extremities: DVT Evaluation: No evidence of DVT seen on physical exam.   Discharge instructions:  "Baby and Me Booklet" and [facility] Booklet  Discharge Medications:  Allergies as of 06/24/2017   No Known Allergies     Medication List    TAKE these medications  cetirizine 10 MG tablet Commonly known as:  ZYRTEC Take 10 mg by mouth daily.   ibuprofen 600 MG tablet Commonly known as:  ADVIL,MOTRIN Take 1 tablet (600 mg total) by mouth every 6 (six) hours.   prenatal multivitamin Tabs tablet Take 1 tablet by mouth daily.   ranitidine 150 MG tablet Commonly known as:  ZANTAC Take 150 mg by mouth 2 (two) times daily.       Diet: routine diet  Activity: Advance as tolerated. Pelvic rest x 6 weeks.   Follow up:6 weeks    Signed: Marlinda MikeBAILEY, Olivene Cookston CNM, MSN, [facility] 07/05/2017, 6:09 PM
